# Patient Record
Sex: Male | Born: 1990 | Race: White | Hispanic: No | Marital: Single | Smoking: Current some day smoker
Health system: Southern US, Community
[De-identification: ages and names within clinical notes are randomized; demographics above are authoritative.]

---

## 2021-12-29 ENCOUNTER — Emergency Department
Admission: EM | Admit: 2021-12-29 | Discharge: 2021-12-29 | Disposition: A | Discharge status: Home / Self Care | Payer: Self-pay | Attending: Emergency Medicine | Admitting: Emergency Medicine

## 2021-12-29 ENCOUNTER — Emergency Department: Payer: Self-pay

## 2021-12-29 ENCOUNTER — Other Ambulatory Visit: Payer: Self-pay

## 2021-12-29 DIAGNOSIS — S41112D Laceration without foreign body of left upper arm, subsequent encounter: Secondary | ICD-10-CM | POA: Insufficient documentation

## 2021-12-29 DIAGNOSIS — Z5189 Encounter for other specified aftercare: Secondary | ICD-10-CM

## 2021-12-29 DIAGNOSIS — Z4800 Encounter for change or removal of nonsurgical wound dressing: Secondary | ICD-10-CM | POA: Insufficient documentation

## 2021-12-29 DIAGNOSIS — W228XXD Striking against or struck by other objects, subsequent encounter: Secondary | ICD-10-CM | POA: Insufficient documentation
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
## 2021-12-29 NOTE — Discharge Instructions (Signed)
You can continue taking Tylenol and ibuprofen alternating for pain.

## 2023-01-31 IMAGING — DX DG FOREARM 2V*L*
2 series · 2 of 2 positions shown · non-contrast
Comparison: None.

CLINICAL DATA: Concern for fracture

EXAM:
LEFT FOREARM - 2 VIEW

[forearm ap]
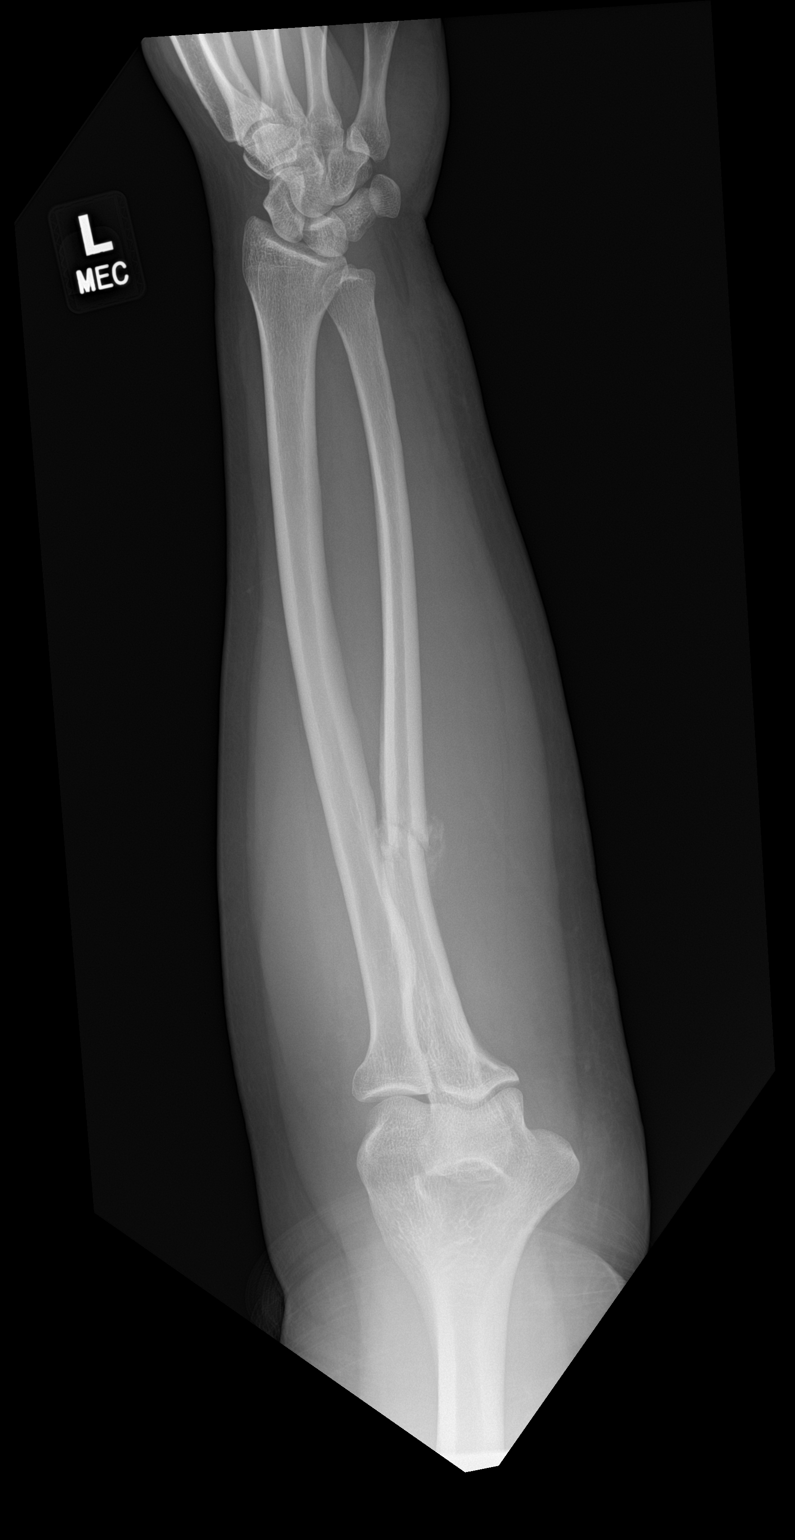

[forearm lat]
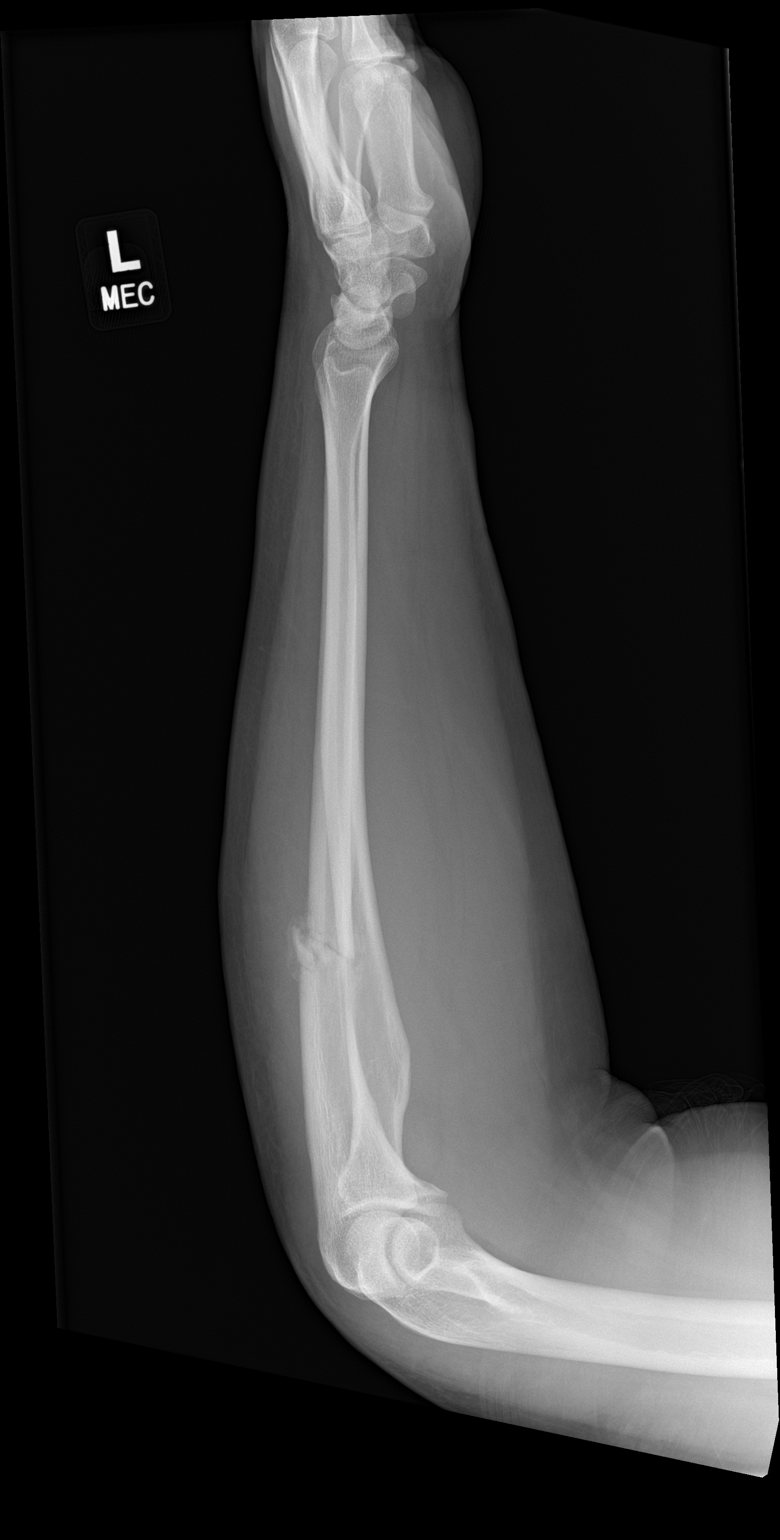

[2 of 2 positions shown; findings below may reference images not displayed]

FINDINGS: Comminuted fracture of the proximal diaphysis of the left ulna, with
evidence of osseous bridging, consistent with subacute fracture. No
significant soft tissue abnormality. No additional fracture is seen.
IMPRESSION: Subacute comminuted fracture of the proximal ulnar diaphysis. No
additional fracture is seen.
